# Patient Record
Sex: Female | Born: 1980
Health system: Southern US, Community
[De-identification: ages and names within clinical notes are randomized; demographics above are authoritative.]

## PROBLEM LIST (undated history)

## (undated) HISTORY — PX: APPENDECTOMY: SHX54

---
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
## 2003-02-07 ENCOUNTER — Emergency Department (HOSPITAL_COMMUNITY): Admission: EM | Admit: 2003-02-07 | Discharge: 2003-02-07 | Payer: Self-pay | Admitting: Emergency Medicine

## 2003-02-10 ENCOUNTER — Emergency Department (HOSPITAL_COMMUNITY): Admission: EM | Admit: 2003-02-10 | Discharge: 2003-02-10 | Payer: Self-pay | Admitting: Emergency Medicine

## 2006-04-03 ENCOUNTER — Emergency Department (HOSPITAL_COMMUNITY): Admission: EM | Admit: 2006-04-03 | Discharge: 2006-04-03 | Payer: Self-pay | Admitting: Emergency Medicine

## 2008-02-15 IMAGING — US US PELVIS COMPLETE MODIFY
1 series · 14 of 25 positions shown · non-contrast
Comparison: none

CLINICAL DATA: Abdominal and low back pain. 
TRANSABDOMINAL AND TRANSVAGINAL PELVIC ULTRASOUND:
TECHNIQUE: Both transabdominal and transvaginal ultrasound examinations of the pelvis were performed including evaluation of the uterus, ovaries, adnexal regions, and pelvic cul-de-sac.

[Series 1: unknown · 0.32mm/px · 14 of 55 slices shown]
[im 1/55]
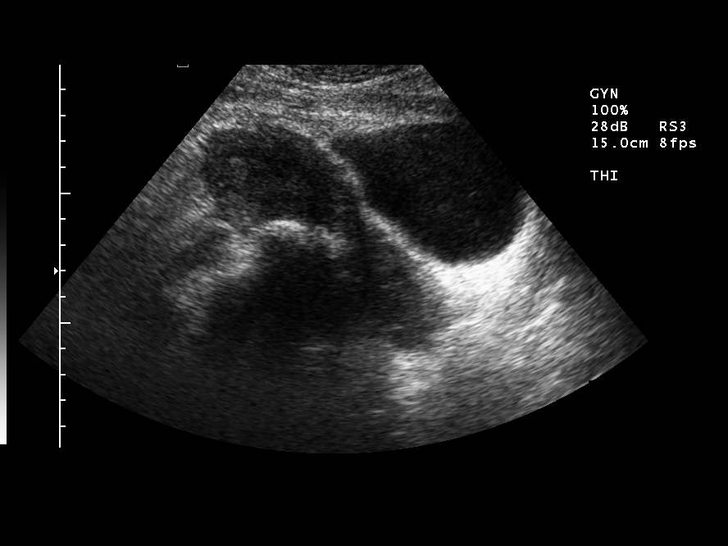
[im 5/55]
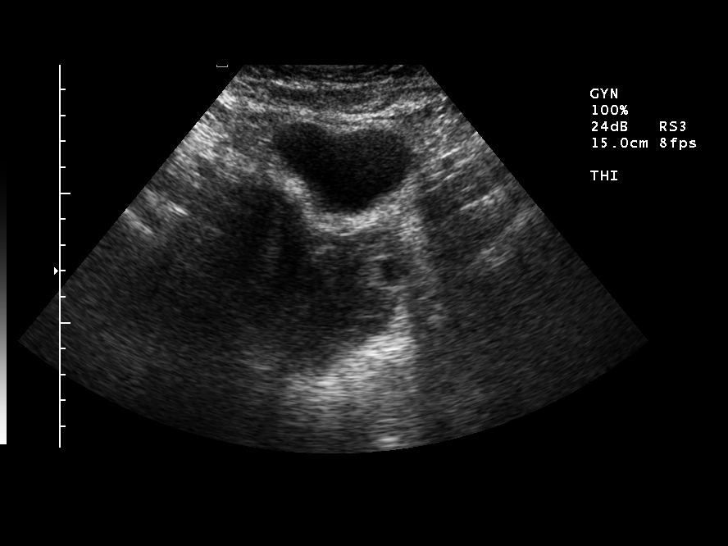
[im 10/55]
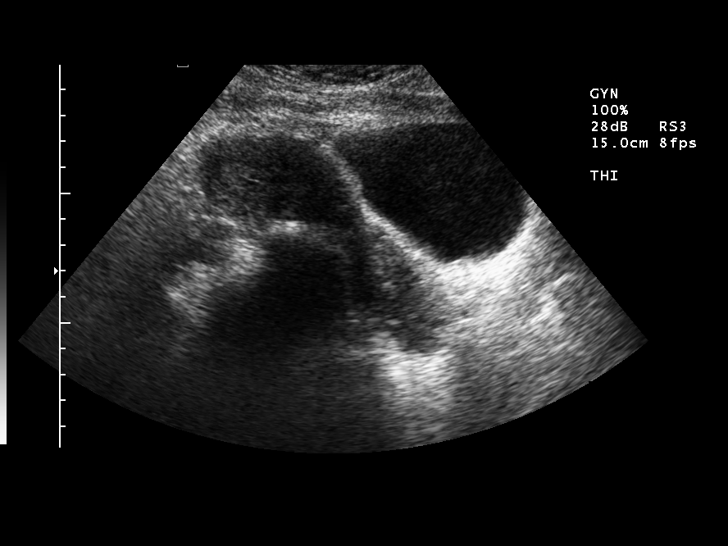
[im 14/55]
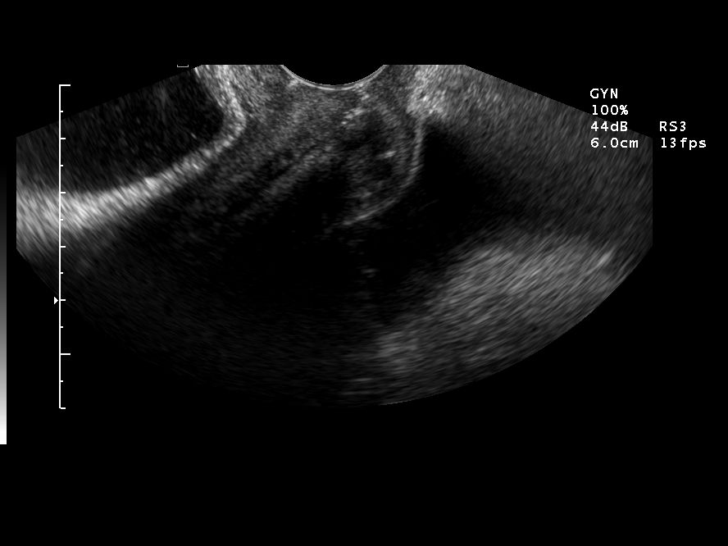
[im 19/55]
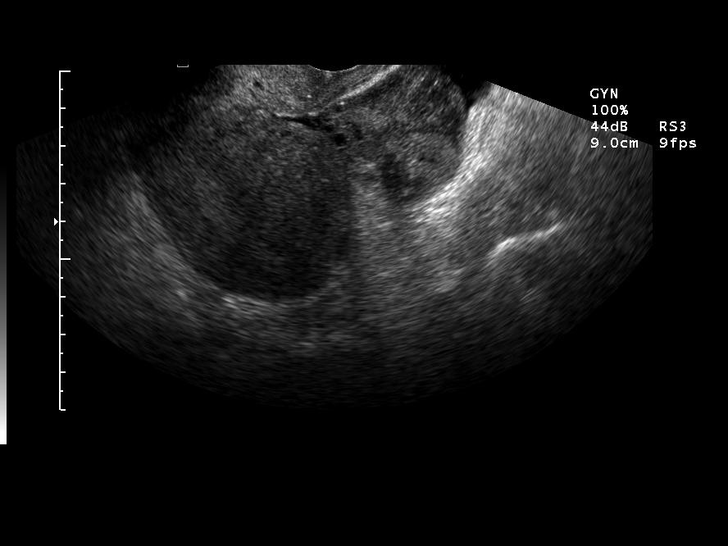
[im 21/55]
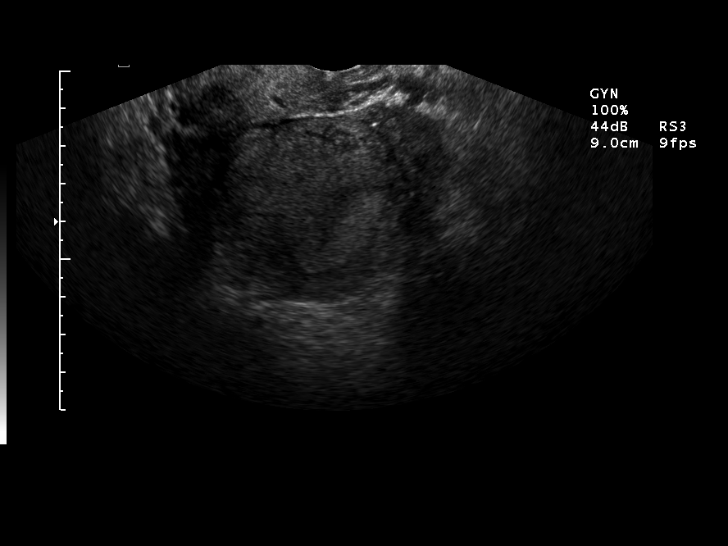
[im 25/55]
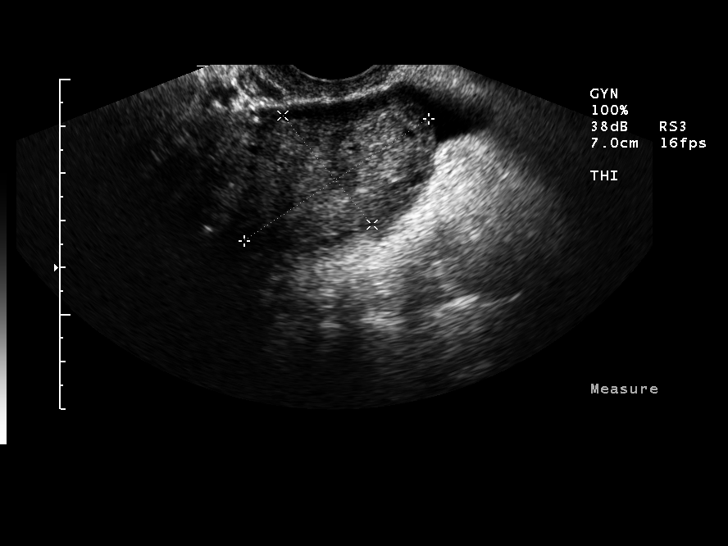
[im 30/55]
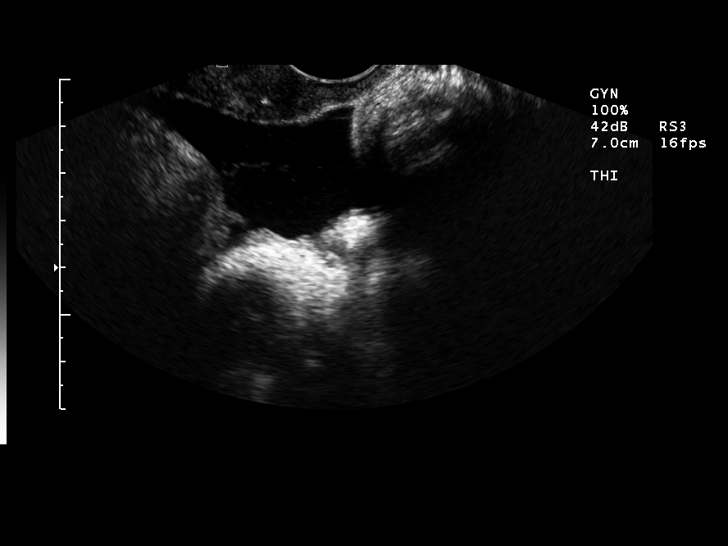
[im 34/55]
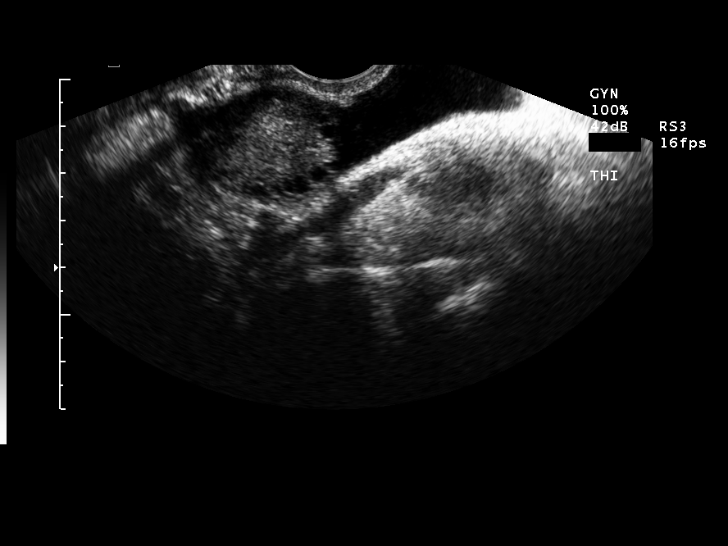
[im 37/55]
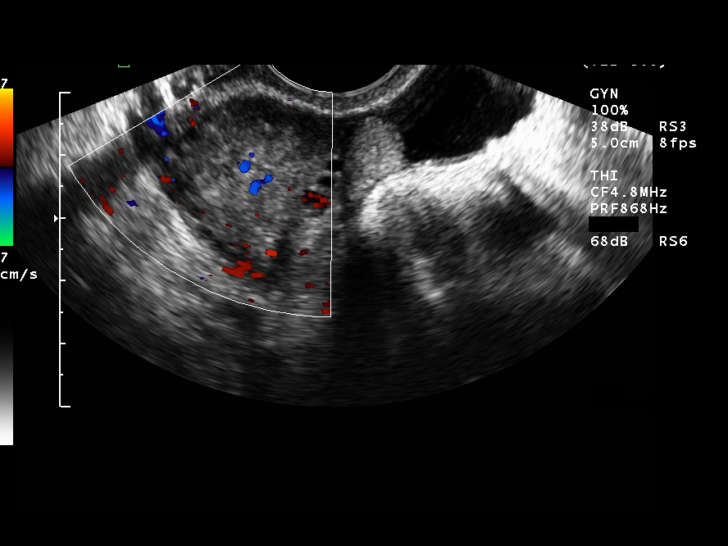
[im 41/55]
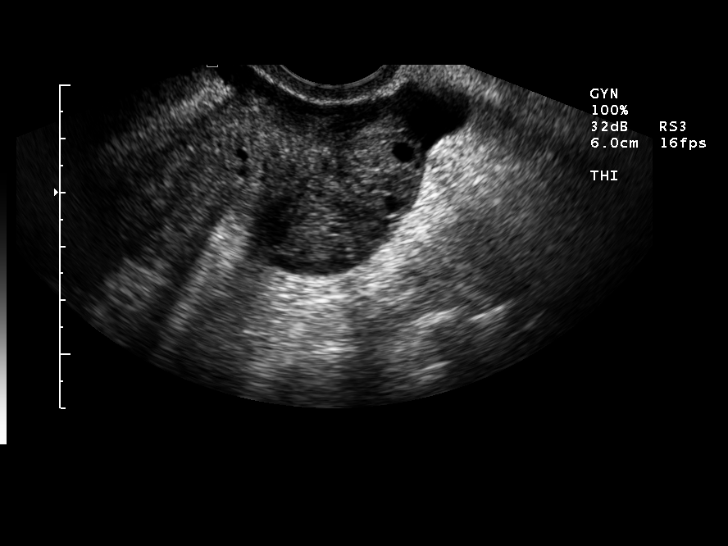
[im 46/55]
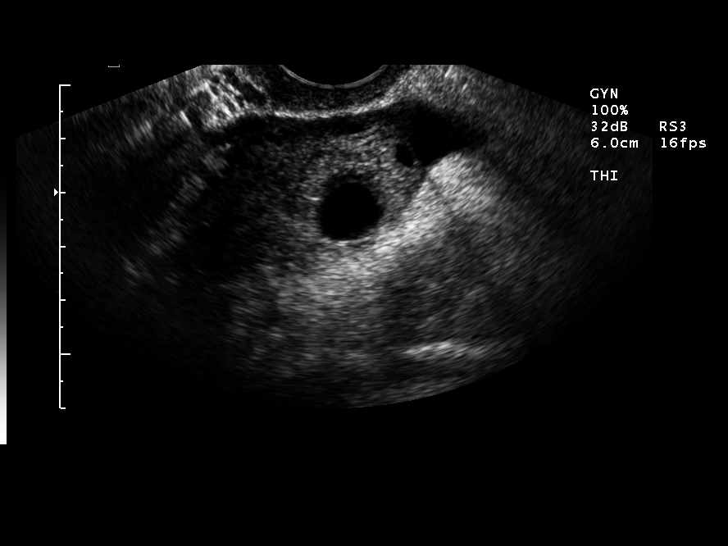
[im 50/55]
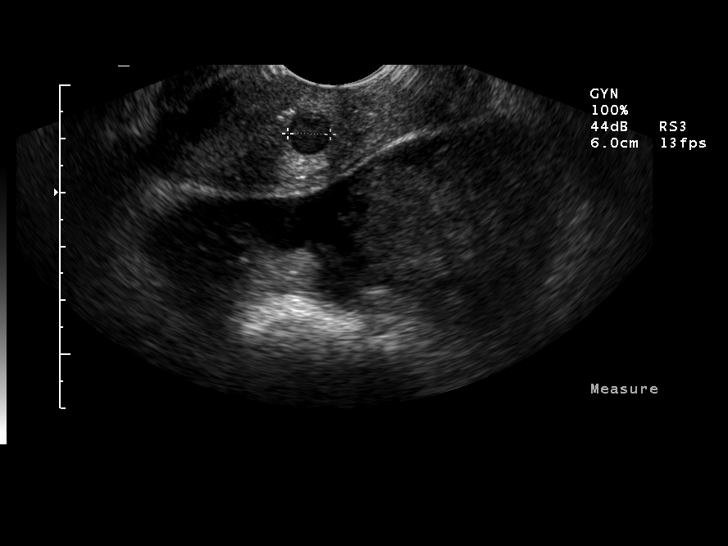
[im 55/55]
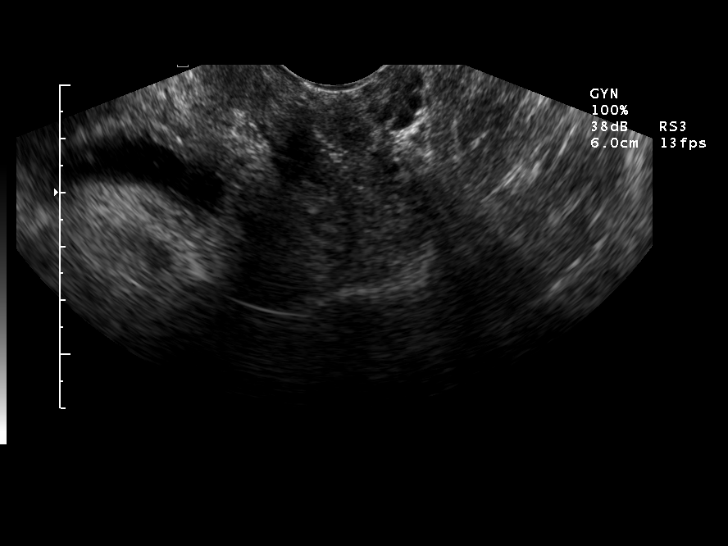

[14 of 25 positions shown; findings below may reference images not displayed]

FINDINGS: The uterus measures 9.0 cm in length and the fundus measures 5.1 x 5.4 cm.  A 0.7 x 0.8 x 1.1 cm cervical Nabothian cyst. Endometrial stripe complex measures 0.9 mm.  The right ovary measures 2.1 x 2.6 x 3.1 cm.  The left ovary appears somewhat prominent in size measuring 3.0 x 3.9 x 4.7 cm.  There is a small left ovarian cyst measuring 1.0 x 1.2 x 1.3 cm.  The left ovary is not only prominent in size, but has somewhat of a lobulated contour. I feel this most likely represents a normal ovary. There is bilateral adnexal/ovarian color flow. A moderate amount of free pelvic fluid.
IMPRESSION: Prominent in size, but otherwise unremarkable left ovary. Free pelvic fluid is noted, probably somewhat prominent in amount for physiologic fluid. It is possible the patient may have ruptured an ovarian cyst.

## 2008-02-15 IMAGING — CT CT ABDOMEN W/O CM
2 of 4 series · 17 of 46 positions shown, 19 images · IV contrast (agent unspecified)
Comparison: none

CLINICAL DATA: Left flank pain.  
ABDOMEN CT WITHOUT CONTRAST:
TECHNIQUE: Multidetector CT imaging of the abdomen was performed following the standard protocol without IV contrast.
TECHNIQUE: Multidetector CT imaging of the pelvis was performed following the standard protocol without IV contrast.

[Series 2: stone_wo 5.0 b40f st · axial · 0.67mm/px · z∈[-457,-101]mm · 14 of 101 slices shown, 16 images]
[im 6/101  soft-tissue]
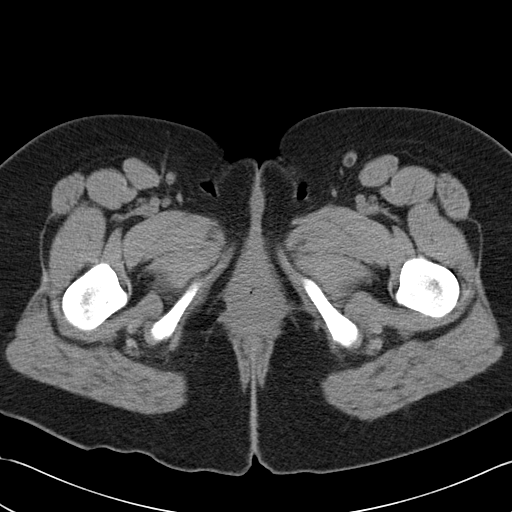
[im 6/101  bone]
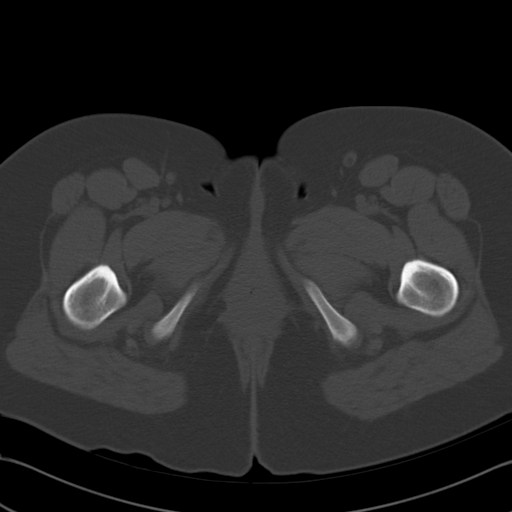
[im 12/101  soft-tissue]
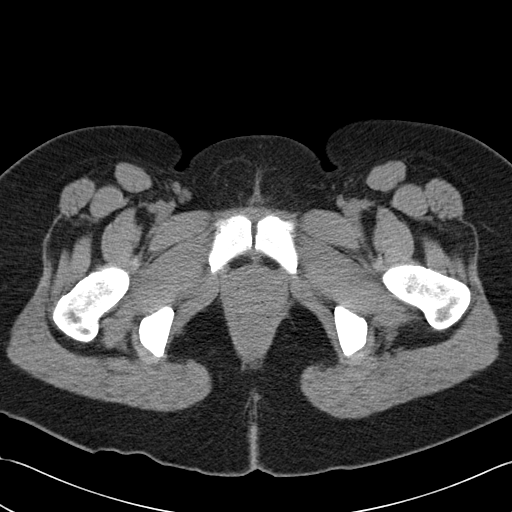
[im 18/101  soft-tissue]
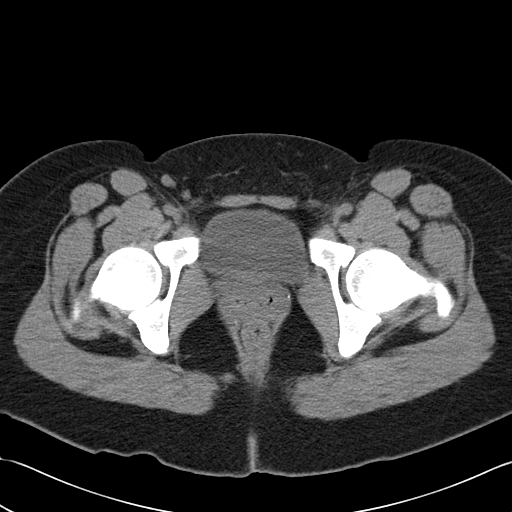
[im 30/101  soft-tissue]
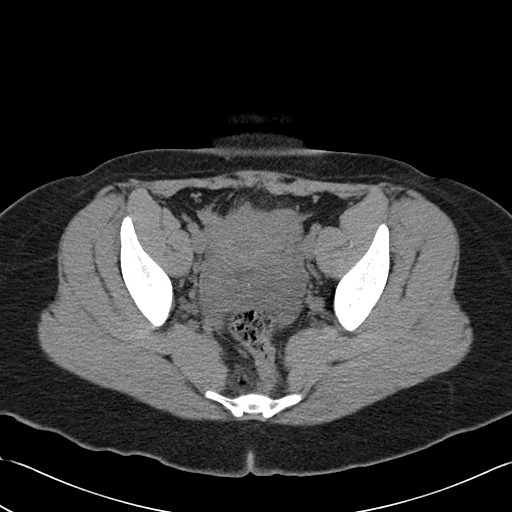
[im 36/101  soft-tissue]
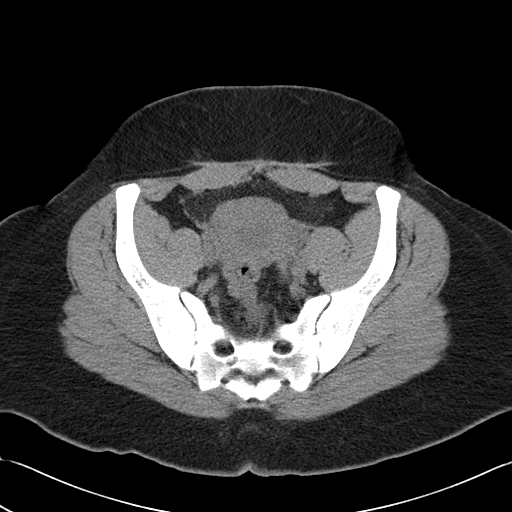
[im 42/101  soft-tissue]
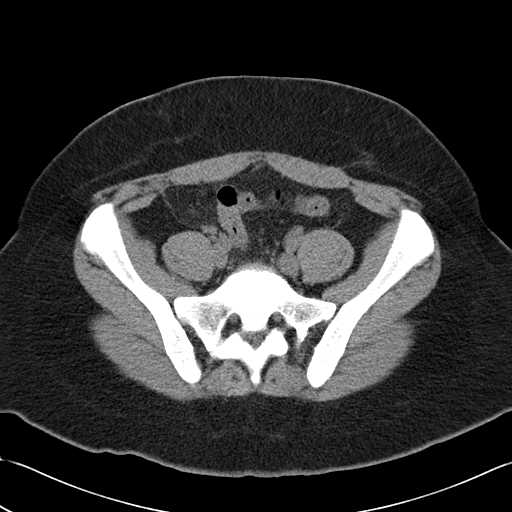
[im 48/101  soft-tissue]
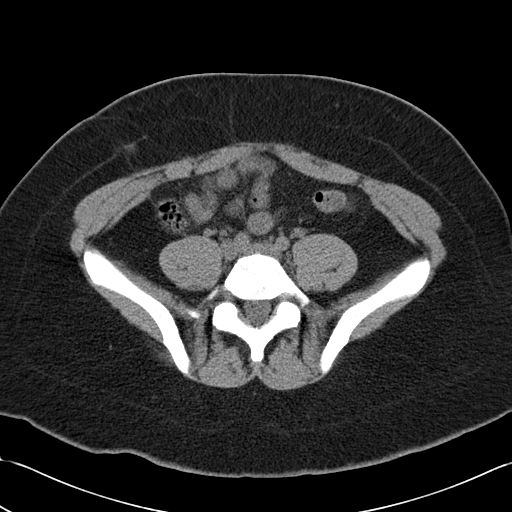
[im 53/101  soft-tissue]
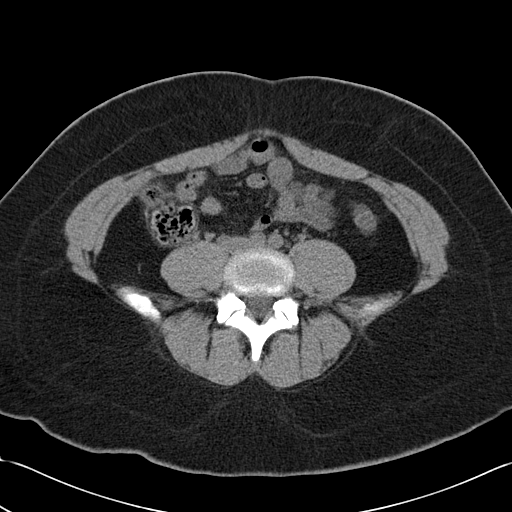
[im 59/101  soft-tissue]
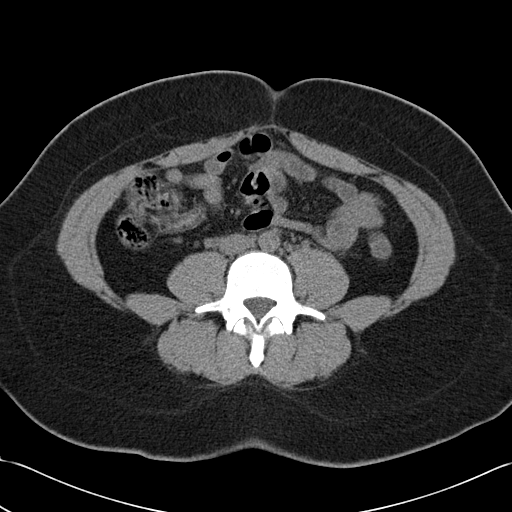
[im 59/101  bone]
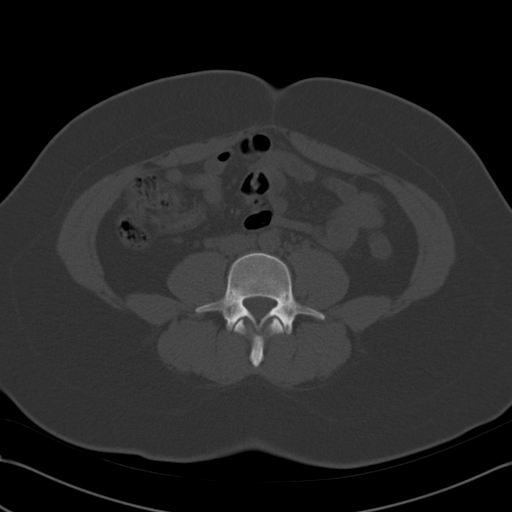
[im 65/101  soft-tissue]
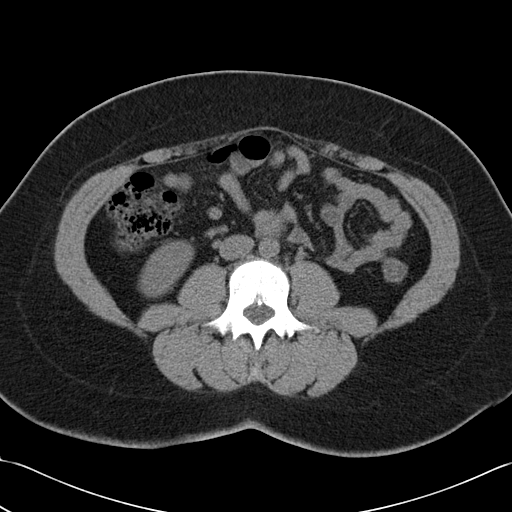
[im 77/101  soft-tissue]
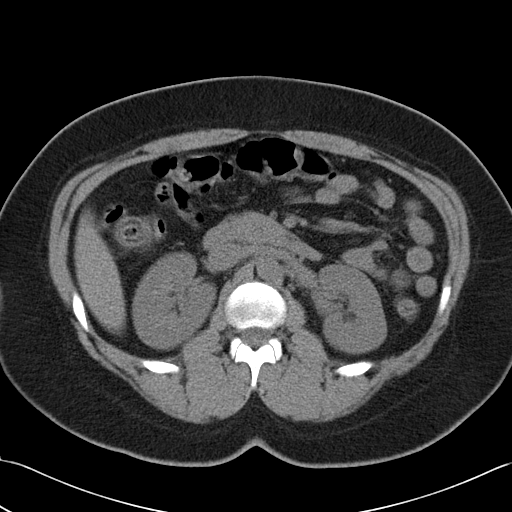
[im 83/101  soft-tissue]
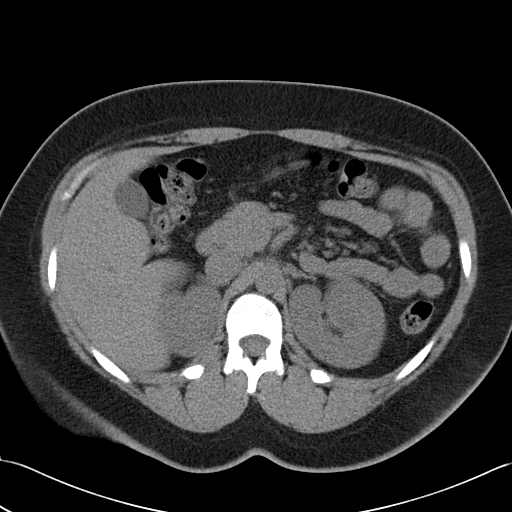
[im 89/101  soft-tissue]
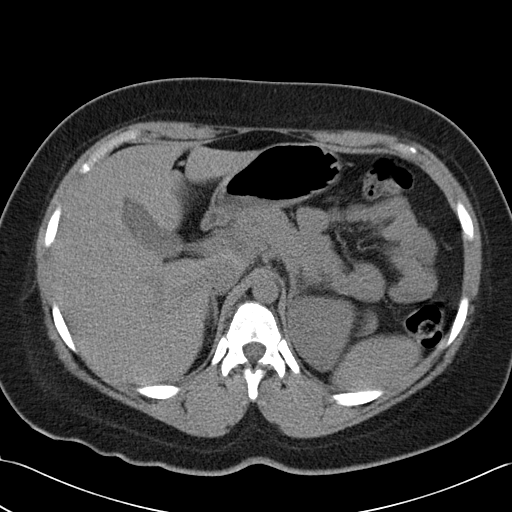
[im 95/101  soft-tissue]
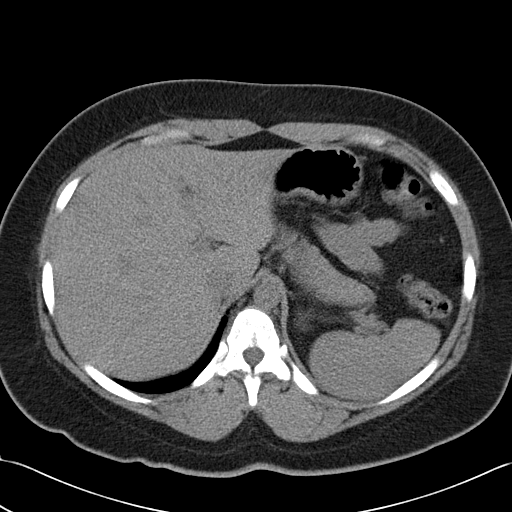

[Series 602: coronal · coronal · 0.82mm/px · 3 of 81 slices shown]
[im 27/81  soft-tissue]
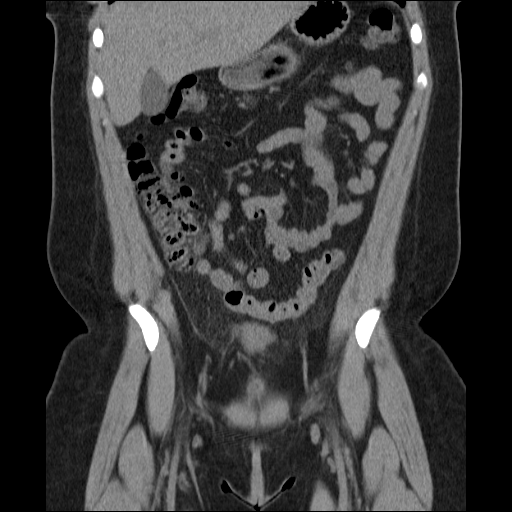
[im 36/81  soft-tissue]
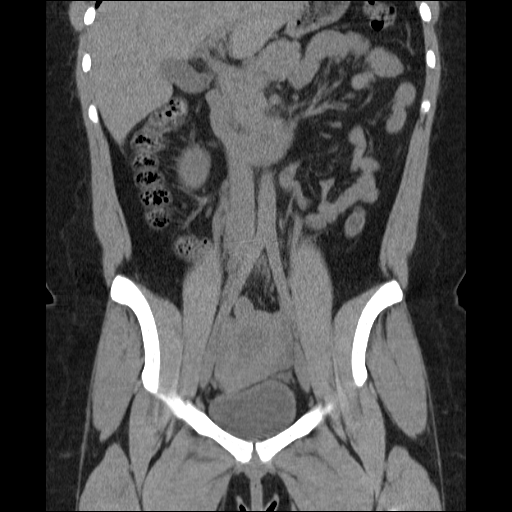
[im 45/81  soft-tissue]
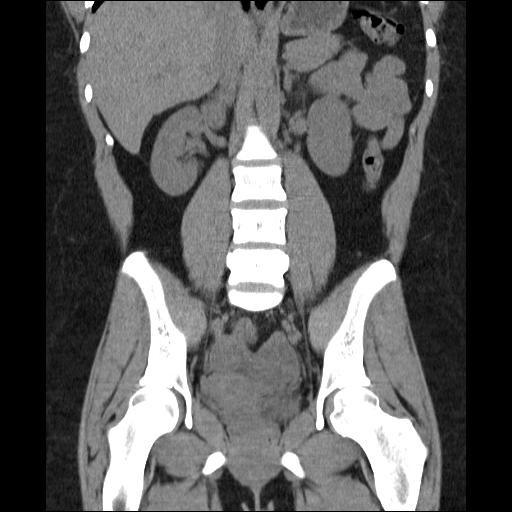

[17 of 46 positions shown; findings below may reference images not displayed]

FINDINGS: No urinary tract calcifications or evidence of obstruction.  No acute abdominal findings.
IMPRESSION: Negative abdominal CT scan.  
PELVIS CT WITHOUT CONTRAST:
FINDINGS: There is free pelvic fluid.  I am concerned that there [DATE] or 2 pelvic masses, with possible left adnexal masses measuring approximately 4.2 x 4.8 cm and potentially a right adnexal mass as well.  I would recommend pelvic ultrasound for further assessment.
IMPRESSION: Free pelvic fluid, nonspecific finding.  This may be due to recent rupture of ovarian cyst.  Suspicion for pelvic mass(es).  Recommend pelvic ultrasound.

## 2016-08-08 ENCOUNTER — Encounter (HOSPITAL_BASED_OUTPATIENT_CLINIC_OR_DEPARTMENT_OTHER): Payer: Self-pay | Admitting: Emergency Medicine

## 2016-08-08 ENCOUNTER — Emergency Department (HOSPITAL_BASED_OUTPATIENT_CLINIC_OR_DEPARTMENT_OTHER)
Admission: EM | Admit: 2016-08-08 | Discharge: 2016-08-08 | Disposition: A | Discharge status: Home / Self Care | Payer: BLUE CROSS/BLUE SHIELD | Attending: Emergency Medicine | Admitting: Emergency Medicine

## 2016-08-08 ENCOUNTER — Emergency Department (HOSPITAL_BASED_OUTPATIENT_CLINIC_OR_DEPARTMENT_OTHER): Payer: BLUE CROSS/BLUE SHIELD

## 2016-08-08 DIAGNOSIS — R112 Nausea with vomiting, unspecified: Secondary | ICD-10-CM | POA: Insufficient documentation

## 2016-08-08 DIAGNOSIS — R55 Syncope and collapse: Secondary | ICD-10-CM | POA: Insufficient documentation

## 2016-08-08 DIAGNOSIS — J069 Acute upper respiratory infection, unspecified: Secondary | ICD-10-CM | POA: Diagnosis not present

## 2016-08-08 DIAGNOSIS — R05 Cough: Secondary | ICD-10-CM | POA: Diagnosis present

## 2016-08-08 MED ORDER — BENZONATATE 100 MG PO CAPS: 100.0000 mg | ORAL_CAPSULE | Freq: Three times a day (TID) | ORAL | 0 refills | Status: DC

## 2016-08-08 MED ORDER — ONDANSETRON 4 MG PO TBDP: 4.0000 mg | ORAL_TABLET | Freq: Three times a day (TID) | ORAL | 0 refills | Status: DC | PRN

## 2016-08-08 MED FILL — ONDANSETRON ODT 4 MG TABLET: 4 | 6 days supply | Qty: 6 | Fill #0

## 2016-08-08 MED FILL — BENZONATATE 100 MG CAP: 100 | 5 days supply | Qty: 15 | Fill #0

## 2016-08-08 NOTE — ED Provider Notes (Signed)
[facility]   CSN: [id number] Arrival date & time: 08/08/16  0941     History   Chief Complaint Chief Complaint  Patient presents with  . Cough  . Nausea    HPI Ashley Shelton is a 36 y.o. female.  Patient presents 3 days of upper respiratory infection symptoms including runny nose, nasal congestion, and sore throat. Patient developed a cough 2 days ago and has been persistently coughing. 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 She had ringing in her ears and tunnel vision for approximately 3 minutes. This then resolved and patient became very nauseous and vomited. She did not fully pass out. She denies any chest pains or shortness of breath during this time. She no longer feels lightheaded but continues to feel nauseous. She continues coughing. Lightheaded spell did not seem to be triggered or caused by coughing. Patient has been taking over-the-counter cough medications and decongestants for her symptoms. No other treatments. She has been eating and drinking well. No history of cardiac problems or arrhythmia. The onset of this condition was acute. The course is constant. Aggravating factors: none. Alleviating factors: none.        History reviewed. No pertinent past medical history.  There are no active problems to display for this patient.   Past Surgical History:  Procedure Laterality Date  . APPENDECTOMY    . CESAREAN SECTION      OB History    No data available       Home Medications    Prior to Admission medications   Not on File    Family History No family history on file.  Social History Social History  Substance Use Topics  . Smoking status: Never Smoker  . Smokeless tobacco: Never Used  . Alcohol use Yes     Comment: occ     Allergies   Patient has no known allergies.   Review of Systems Review of Systems  Constitutional: Negative for  fever.  HENT: Positive for congestion, rhinorrhea and sore throat.   Eyes: Negative for redness.  Respiratory: Positive for cough.   Cardiovascular: Negative for chest pain.  Gastrointestinal: Positive for nausea and vomiting. Negative for abdominal pain and diarrhea.  Genitourinary: Negative for dysuria.  Musculoskeletal: Negative for myalgias.  Skin: Negative for rash.  Neurological: Positive for dizziness. Negative for syncope (+ Near-syncope) and headaches.     Physical Exam Updated Vital Signs BP (!) 149/52 (BP Location: Right Arm)   Pulse 85   Temp 98.1 F (36.7 C) (Oral)   Resp 18   Ht 5\' 6"  (1.676 m)   Wt 108.9 kg (240 lb)   LMP 07/28/2016 (Approximate)   SpO2 99%   BMI 38.74 kg/m   Physical Exam  Constitutional: She appears well-developed and well-nourished.  HENT:  Head: Normocephalic and atraumatic.  Right Ear: Tympanic membrane, external ear and ear canal normal.  Left Ear: Tympanic membrane, external ear and ear canal normal.  Nose: Mucosal edema present. No rhinorrhea.  Mouth/Throat: Uvula is midline, oropharynx is clear and moist and mucous membranes are normal. Mucous membranes are not dry. No oral lesions. No trismus in the jaw. No uvula swelling. No oropharyngeal exudate, posterior oropharyngeal edema, posterior oropharyngeal erythema or tonsillar abscesses.  Eyes: Conjunctivae are normal. Right eye exhibits no discharge. Left eye exhibits no discharge.  Neck: Normal range of motion. Neck supple.  Cardiovascular: Normal rate, regular rhythm  and normal heart sounds.   No murmur heard. Pulmonary/Chest: Effort normal and breath sounds normal. No respiratory distress. She has no wheezes. She has no rales.  Frequent coughing during exam.  Abdominal: Soft. There is no tenderness.  Lymphadenopathy:    She has no cervical adenopathy.  Neurological: She is alert.  Skin: Skin is warm and dry.  Psychiatric: She has a normal mood and affect.  Nursing note and  vitals reviewed.    ED Treatments / Results  Labs (all labs ordered are listed, but only abnormal results are displayed) Labs Reviewed - No data to display  ED ECG REPORT   Date: 08/08/2016  Rate: 75  Rhythm: normal sinus rhythm  QRS Axis: normal  Intervals: normal  ST/T Wave abnormalities: normal  Conduction Disutrbances:none  Narrative Interpretation:   Old EKG Reviewed: unchanged  I have personally reviewed the EKG tracing and agree with the computerized printout as noted.  Radiology Dg Chest 2 View  Result Date: 08/08/2016 CLINICAL DATA:  Cough. EXAM: CHEST  2 VIEW COMPARISON:  None FINDINGS: The heart size and mediastinal contours are within normal limits. Both lungs are clear. The visualized skeletal structures are unremarkable. IMPRESSION: No active cardiopulmonary disease. Electronically Signed   By: Signa Kell M.D.   On: 08/08/2016 10:17    Procedures Procedures (including critical care time)  Medications Ordered in ED Medications - No data to display   Initial Impression / Assessment and Plan / ED Course  I have reviewed the triage vital signs and the nursing notes.  Pertinent labs & imaging results that were available during my care of the patient were reviewed by me and considered in my medical decision making (see chart for details).     Patient seen and examined. CXR reviewed. Given Near syncopal spell, will check EKG and orthostatics. Likely discharge to home with medicine for cough, nausea, and conservative measures for URI.   Vital signs reviewed and are as follows: BP (!) 149/52 (BP Location: Right Arm)   Pulse 85   Temp 98.1 F (36.7 C) (Oral)   Resp 18   Ht 5\' 6"  (1.676 m)   Wt 108.9 kg (240 lb)   LMP 07/28/2016 (Approximate)   SpO2 99%   BMI 38.74 kg/m   11:01 AM EKG is normal. Chest x-ray is clear. Patient is not orthostatic. Will discharge to home, patient encouraged to rest.  Return with worsening symptoms, recurrent  lightheadedness or syncope, chest pain, shortness of breath. Patient verbalizes understanding and agrees with plan.      Final Clinical Impressions(s) / ED Diagnoses   Final diagnoses:  Upper respiratory tract infection, unspecified type  Near syncope  Nausea and vomiting, intractability of vomiting not specified, unspecified vomiting type   Patient presents after near syncopal spell today in setting of recent URI. Patient with frequent coughing. Workup unremarkable. LMP 07/28/16. Feel sick. Likely multifactorial due to viral illness. She had a prodrome. EKG and chest x-ray unremarkable. She appears well. No concerning risk factors for cardiogenic syncope.   No dangerous or life-threatening conditions suspected or identified by history, physical exam, and by work-up. No indications for hospitalization identified.      New Prescriptions New Prescriptions   BENZONATATE (TESSALON) 100 MG CAPSULE    Take 1 capsule (100 mg total) by mouth every 8 (eight) hours.   ONDANSETRON (ZOFRAN ODT) 4 MG DISINTEGRATING TABLET    Take 1 tablet (4 mg total) by mouth every 8 (eight) hours as needed for nausea  or vomiting.     Renne Crigler, PA-C 08/08/16 1103    Gwyneth Sprout, MD 08/08/16 [phone]

## 2016-08-08 NOTE — ED Triage Notes (Signed)
Pt reports congestion and mild sore throat on Sunday, progressing to cough yesterday and nausea this morning.  Pt vomited once this morning and states she has felt some dizziness also.  Pt states she has been eating and drinking per her norm this week.  Denies any diarrhea, abdominal or back pain.

## 2016-08-08 NOTE — Discharge Instructions (Signed)
Please read and follow all provided instructions.  Your diagnoses today include:  1. Upper respiratory tract infection, unspecified type   2. Near syncope   3. Nausea and vomiting, intractability of vomiting not specified, unspecified vomiting type    You appear to have an upper respiratory infection (URI). An upper respiratory tract infection, or cold, is a viral infection of the air passages leading to the lungs. It should improve gradually after 5-7 days. You may have a lingering cough that lasts for 2- 4 weeks after the infection.  Tests performed today include:  Vital signs. See below for your results today.   Medications prescribed:   Zofran (ondansetron) - for nausea and vomiting   Tessalon Perles - cough suppressant medication  Take any prescribed medications only as directed. Treatment for your infection is aimed at treating the symptoms. There are no medications, such as antibiotics, that will cure your infection.   Home care instructions:  Follow any educational materials contained in this packet.   Your illness is contagious and can be spread to others, especially during the first 3 or 4 days. It cannot be cured by antibiotics or other medicines. Take basic precautions such as washing your hands often, covering your mouth when you cough or sneeze, and avoiding public places where you could spread your illness to others.   Please continue drinking plenty of fluids.  Use over-the-counter medicines as needed as directed on packaging for symptom relief.  You may also use ibuprofen or tylenol as directed on packaging for pain or fever.  Do not take multiple medicines containing Tylenol or acetaminophen to avoid taking too much of this medication.  Follow-up instructions: Please follow-up with your primary care provider in the next 3 days for further evaluation of your symptoms if you are not feeling better.   Return instructions:   Please return to the Emergency Department if  you experience worsening symptoms.   RETURN IMMEDIATELY IF you develop shortness of breath, confusion or altered mental status, a new rash, become dizzy, faint, or poorly responsive, or are unable to be cared for at home.  Please return if you have persistent vomiting and cannot keep down fluids or develop a fever that is not controlled by tylenol or motrin.    Please return if you have any other emergent concerns.  Additional Information:  Your vital signs today were: BP (!) 149/52 (BP Location: Right Arm)    Pulse 85    Temp 98.1 F (36.7 C) (Oral)    Resp 18    Ht 5\' 6"  (1.676 m)    Wt 108.9 kg (240 lb)    LMP 07/28/2016 (Approximate)    SpO2 99%    BMI 38.74 kg/m  If your blood pressure (BP) was elevated above 135/85 this visit, please have this repeated by your doctor within one month. --------------

## 2016-10-03 ENCOUNTER — Encounter: Payer: Self-pay | Admitting: Family Medicine

## 2016-11-29 ENCOUNTER — Emergency Department (HOSPITAL_BASED_OUTPATIENT_CLINIC_OR_DEPARTMENT_OTHER)
Admission: EM | Admit: 2016-11-29 | Discharge: 2016-11-30 | Disposition: A | Discharge status: Home / Self Care | Payer: BLUE CROSS/BLUE SHIELD | Attending: Emergency Medicine | Admitting: Emergency Medicine

## 2016-11-29 ENCOUNTER — Encounter (HOSPITAL_BASED_OUTPATIENT_CLINIC_OR_DEPARTMENT_OTHER): Payer: Self-pay | Admitting: Emergency Medicine

## 2016-11-29 DIAGNOSIS — H8113 Benign paroxysmal vertigo, bilateral: Secondary | ICD-10-CM | POA: Diagnosis not present

## 2016-11-29 DIAGNOSIS — H6123 Impacted cerumen, bilateral: Secondary | ICD-10-CM | POA: Diagnosis not present

## 2016-11-29 DIAGNOSIS — H8110 Benign paroxysmal vertigo, unspecified ear: Secondary | ICD-10-CM

## 2016-11-29 DIAGNOSIS — R42 Dizziness and giddiness: Secondary | ICD-10-CM | POA: Diagnosis present

## 2016-11-29 LAB — PREGNANCY, URINE: Preg Test, Ur: NEGATIVE

## 2016-11-29 LAB — CBC WITH DIFFERENTIAL/PLATELET
Basophils Absolute: 0 10*3/uL (ref 0.0–0.1)
Basophils Relative: 0 %
Eosinophils Absolute: 0 10*3/uL (ref 0.0–0.7)
Eosinophils Relative: 0 %
HCT: 33 % — ABNORMAL LOW (ref 36.0–46.0)
Hemoglobin: 11.1 g/dL — ABNORMAL LOW (ref 12.0–15.0)
Lymphocytes Relative: 12 %
Lymphs Abs: 1.5 10*3/uL (ref 0.7–4.0)
MCH: 29.5 pg (ref 26.0–34.0)
MCHC: 33.6 g/dL (ref 30.0–36.0)
MCV: 87.8 fL (ref 78.0–100.0)
Monocytes Absolute: 0.4 10*3/uL (ref 0.1–1.0)
Monocytes Relative: 3 %
Neutro Abs: 10.6 10*3/uL — ABNORMAL HIGH (ref 1.7–7.7)
Neutrophils Relative %: 85 %
Platelets: 236 10*3/uL (ref 150–400)
RBC: 3.76 MIL/uL — ABNORMAL LOW (ref 3.87–5.11)
RDW: 12.5 % (ref 11.5–15.5)
WBC: 12.5 10*3/uL — ABNORMAL HIGH (ref 4.0–10.5)

## 2016-11-29 LAB — BASIC METABOLIC PANEL
Anion gap: 8 (ref 5–15)
BUN: 14 mg/dL (ref 6–20)
CO2: 21 mmol/L — ABNORMAL LOW (ref 22–32)
Calcium: 8.4 mg/dL — ABNORMAL LOW (ref 8.9–10.3)
Chloride: 106 mmol/L (ref 101–111)
Creatinine, Ser: 0.73 mg/dL (ref 0.44–1.00)
GFR calc Af Amer: >60 mL/min (ref >60)
GFR calc non Af Amer: >60 mL/min (ref >60)
Glucose, Bld: 198 mg/dL — ABNORMAL HIGH (ref 65–99)
Potassium: 3.3 mmol/L — ABNORMAL LOW (ref 3.5–5.1)
Sodium: 135 mmol/L (ref 135–145)

## 2016-11-29 MED ORDER — SODIUM CHLORIDE 0.9 % IV BOLUS (SEPSIS)
1000.0000 mL | Freq: Once | INTRAVENOUS | Status: AC
  Administered 2016-11-29: 1000 mL via INTRAVENOUS

## 2016-11-29 MED ORDER — ONDANSETRON HCL 4 MG/2ML IJ SOLN
4.0000 mg | Freq: Once | INTRAMUSCULAR | Status: AC
  Administered 2016-11-29: 4 mg via INTRAVENOUS
  Filled 2016-11-29: qty 2

## 2016-11-29 MED ORDER — MECLIZINE HCL 25 MG PO TABS
25.0000 mg | ORAL_TABLET | Freq: Once | ORAL | Status: AC
  Administered 2016-11-29: 25 mg via ORAL
  Filled 2016-11-29: qty 1

## 2016-11-29 MED ORDER — ONDANSETRON HCL 4 MG/2ML IJ SOLN: 4.0000 mg | Freq: Once | INTRAMUSCULAR | Status: DC

## 2016-11-29 NOTE — ED Provider Notes (Signed)
TIME SEEN: 11:01 PM  CHIEF COMPLAINT: Vertigo  HPI: Patient is a 36 year old female with no significant past history who presents emergency department with complaints of vertigo. States she feels that the room is spinning and has had several episodes of vomiting since the vertigo started tonight while at work. No diarrhea. Has mild headache but no head injury. No numbness, tingling or focal weakness. No vision or speech changes. No hearing loss, tinnitus, ear pain. No fever. Has never had vertigo before. Symptoms worse with changes in position. She feels better when she is staying still. No chest pain or shortness of breath. She was orthostatic with EMS.  ROS: See HPI Constitutional: no fever  Eyes: no drainage  ENT: no runny nose   Cardiovascular:  no chest pain  Resp: no SOB  GI: no vomiting GU: no dysuria Integumentary: no rash  Allergy: no hives  Musculoskeletal: no leg swelling  Neurological: no slurred speech ROS otherwise negative  PAST MEDICAL HISTORY/PAST SURGICAL HISTORY:  History reviewed. No pertinent past medical history.  MEDICATIONS:  Prior to Admission medications   Not on File    ALLERGIES:  No Known Allergies  SOCIAL HISTORY:  Social History  Substance Use Topics  . Smoking status: Never Smoker  . Smokeless tobacco: Never Used  . Alcohol use Yes     Comment: occ    FAMILY HISTORY: No family history on file.  EXAM: BP 121/85   Pulse 79   Temp 97.9 F (36.6 C) (Oral)   Resp (!) 29   Ht  (1.676 m)   Wt 108.9 kg (240 lb)   SpO2 100%   BMI 38.74 kg/m  CONSTITUTIONAL: Alert and oriented and responds appropriately to questions. Well-appearing; well-nourished HEAD: Normocephalic EYES: Conjunctivae clear, pupils appear equal, EOMI, No nystagmus noted on exam ENT: normal nose; moist mucous membranes, initially patient has bilateral cerumen impaction and I'm unable to visualize her tympanic membranes NECK: Supple, no meningismus, no nuchal  rigidity, no LAD  CARD: RRR; S1 and S2 appreciated; no murmurs, no clicks, no rubs, no gallops RESP: Normal chest excursion without splinting or tachypnea; breath sounds clear and equal bilaterally; no wheezes, no rhonchi, no rales, no hypoxia or respiratory distress, speaking full sentences ABD/GI: Normal bowel sounds; non-distended; soft, non-tender, no rebound, no guarding, no peritoneal signs, no hepatosplenomegaly BACK:  The back appears normal and is non-tender to palpation, there is no CVA tenderness EXT: Normal ROM in all joints; non-tender to palpation; no edema; normal capillary refill; no cyanosis, no calf tenderness or swelling    SKIN: Normal color for age and race; warm; no rash NEURO: Moves all extremities equally; Strength 5/5 in all four extremities.  Normal sensation diffusely.  CN 2-12 grossly intact.  No dysmetria to finger to nose testing bilaterally.  Normal speech.  PSYCH: The patient's mood and manner are appropriate. Grooming and personal hygiene are appropriate.  MEDICAL DECISION MAKING: Patient here with vertigo. Suspect peripheral cause. She has no risk factors for stroke. She does have bilateral cerumen impaction. We will perform cerumen removal. Will treat symptomatic with IV fluids, Zofran, meclizine. Labs obtained in triage are unremarkable. Hemoglobin is 11. We'll also obtain a pregnancy test. EKG shows no ischemic abnormality.  ED PROGRESS: Patient's pregnancy test is negative. She is still symptomatic after meclizine and one random Zofran. We'll give another liter fluid, Valium, another dose of Zofran and reassess.   Patient reports feeling much better. She is able to drink and ambulate without difficulty  or assistance. No further vomiting. We have irrigated both of her ears and now they are completely clear. The tympanic membranes bilaterally are normal without erythema, bulging, purulence or effusion. No perforation. External auditory canals are patent without  erythema or swelling. There is no drainage at this time.  Suspect BPPV causing her symptoms. I feel she is safe to be discharged. Given information for Epley maneuvers at home. Given ENT follow-up if no improvement with outpatient symptomatic treatment. I feel she is safe for discharge. She has been advised not to drive until her vertigo has completely resolved. Family at bedside to drive her home.  At this time, I do not feel there is any life-threatening condition present. I have reviewed and discussed all results (EKG, imaging, lab, urine as appropriate) and exam findings with patient/family. I have reviewed nursing notes and appropriate previous records.  I feel the patient is safe to be discharged home without further emergent workup and can continue workup as an outpatient as needed. Discussed usual and customary return precautions. Patient/family verbalize understanding and are comfortable with this plan.  Outpatient follow-up has been provided if needed. All questions have been answered.      .Ear Cerumen Removal Date/Time: 11/30/2016 3:06 AM Performed by: Raelyn NumberWARD, Leniyah Martell N Authorized by: Raelyn NumberWARD, Asa Baudoin N   Consent:    Consent obtained:  Verbal   Consent given by:  Patient   Risks discussed:  Bleeding, dizziness, infection, incomplete removal, pain and TM perforation   Alternatives discussed:  No treatment Procedure details:    Location:  R ear and L ear   Procedure type: irrigation   Post-procedure details:    Inspection:  TM intact   Hearing quality:  Improved   Patient tolerance of procedure:  Tolerated well, no immediate complications      EKG Interpretation  Date/Time:  Thursday November 29 2016 21:29:19 EDT Ventricular Rate:  72 PR Interval:    QRS Duration: 102 QT Interval:  427 QTC Calculation: 468 R Axis:   48 Text Interpretation:  Sinus rhythm Abnormal R-wave progression, early transition Borderline T abnormalities, anterior leads No significant change since last  tracing Confirmed by Rochele RaringWard, Robinn Overholt [id number]) on 11/29/2016 11:01:07 PM         Delontae Lamm, Layla MawKristen N, DO 11/30/16 [id number]

## 2016-11-29 NOTE — ED Triage Notes (Addendum)
EMS initiated NS bolus and gave  zofran IV. CBG 160 by EMS

## 2016-11-29 NOTE — ED Notes (Signed)
EDP into room 

## 2016-11-29 NOTE — ED Notes (Signed)
Alert, NAD, calm, interactive, resps e/u, speaking in clear complete sentences, no dyspnea noted, skin W&D, VSS, "here s/p syncope while sitting at work, feel cold and weak now, felt dizziness earlier (resolved while lying), have vomited x5 in last 24 hrs, h/o anemia, no longer takes iron, admits to some nausea now, (denies: pain, sob, dizziness or visual changes). [facility]

## 2016-11-29 NOTE — ED Triage Notes (Signed)
Pt became dizzy while at work. Pt reports starting her menstrual cycle today and has a heavy flow. EMS reports pt with positive orthostatic VS.

## 2016-11-30 MED ORDER — SODIUM CHLORIDE 0.9 % IV BOLUS (SEPSIS)
1000.0000 mL | Freq: Once | INTRAVENOUS | Status: AC
  Administered 2016-11-30: 1000 mL via INTRAVENOUS

## 2016-11-30 MED ORDER — ONDANSETRON HCL 4 MG/2ML IJ SOLN
4.0000 mg | Freq: Once | INTRAMUSCULAR | Status: AC
  Administered 2016-11-30: 4 mg via INTRAVENOUS
  Filled 2016-11-30: qty 2

## 2016-11-30 MED ORDER — DIAZEPAM 5 MG/ML IJ SOLN
2.5000 mg | Freq: Once | INTRAMUSCULAR | Status: AC
  Administered 2016-11-30: 2.5 mg via INTRAVENOUS
  Filled 2016-11-30: qty 2

## 2016-11-30 MED ORDER — ONDANSETRON 4 MG PO TBDP: 4.0000 mg | ORAL_TABLET | Freq: Four times a day (QID) | ORAL | 0 refills | Status: DC | PRN

## 2016-11-30 MED ORDER — MECLIZINE HCL 25 MG PO TABS
25.0000 mg | ORAL_TABLET | Freq: Three times a day (TID) | ORAL | 0 refills | Status: DC | PRN
Start: 2016-11-30 — End: 2022-08-10

## 2016-11-30 NOTE — ED Notes (Signed)
ED Provider at bedside. 

## 2016-11-30 NOTE — ED Notes (Signed)
Alert, NAD, calm, interactive, resps e/u, speaking in clear complete sentences, no dyspnea noted, skin W&D, VSS, denies nausea or dizziness at this time, "feel better".  Family at [facility].

## 2016-11-30 NOTE — Discharge Instructions (Signed)
To find a primary care or specialty doctor please call [phone] or [phone] to access "[facility] Find a Doctor Service." ° °You may also go on the [facility] website at [email or web address] ° °There are also multiple [facility], [facility], [facility] and [facility] practices throughout the [place] that are frequently accepting new patients. You may find a clinic that is close to your home and contact them. ° °[facility] -  °[address] °[place] [address] °[phone] ° ° °[facility] -  °[address] °[place] [address] °[phone] ° ° °[facility] - °[address]  °[place] [address] °[phone] ° ° °

## 2018-06-22 IMAGING — DX DG CHEST 2V
2 series · 2 of 2 positions shown · non-contrast
Comparison: None

CLINICAL DATA: Cough.

EXAM:
CHEST  2 VIEW

[chest pa]
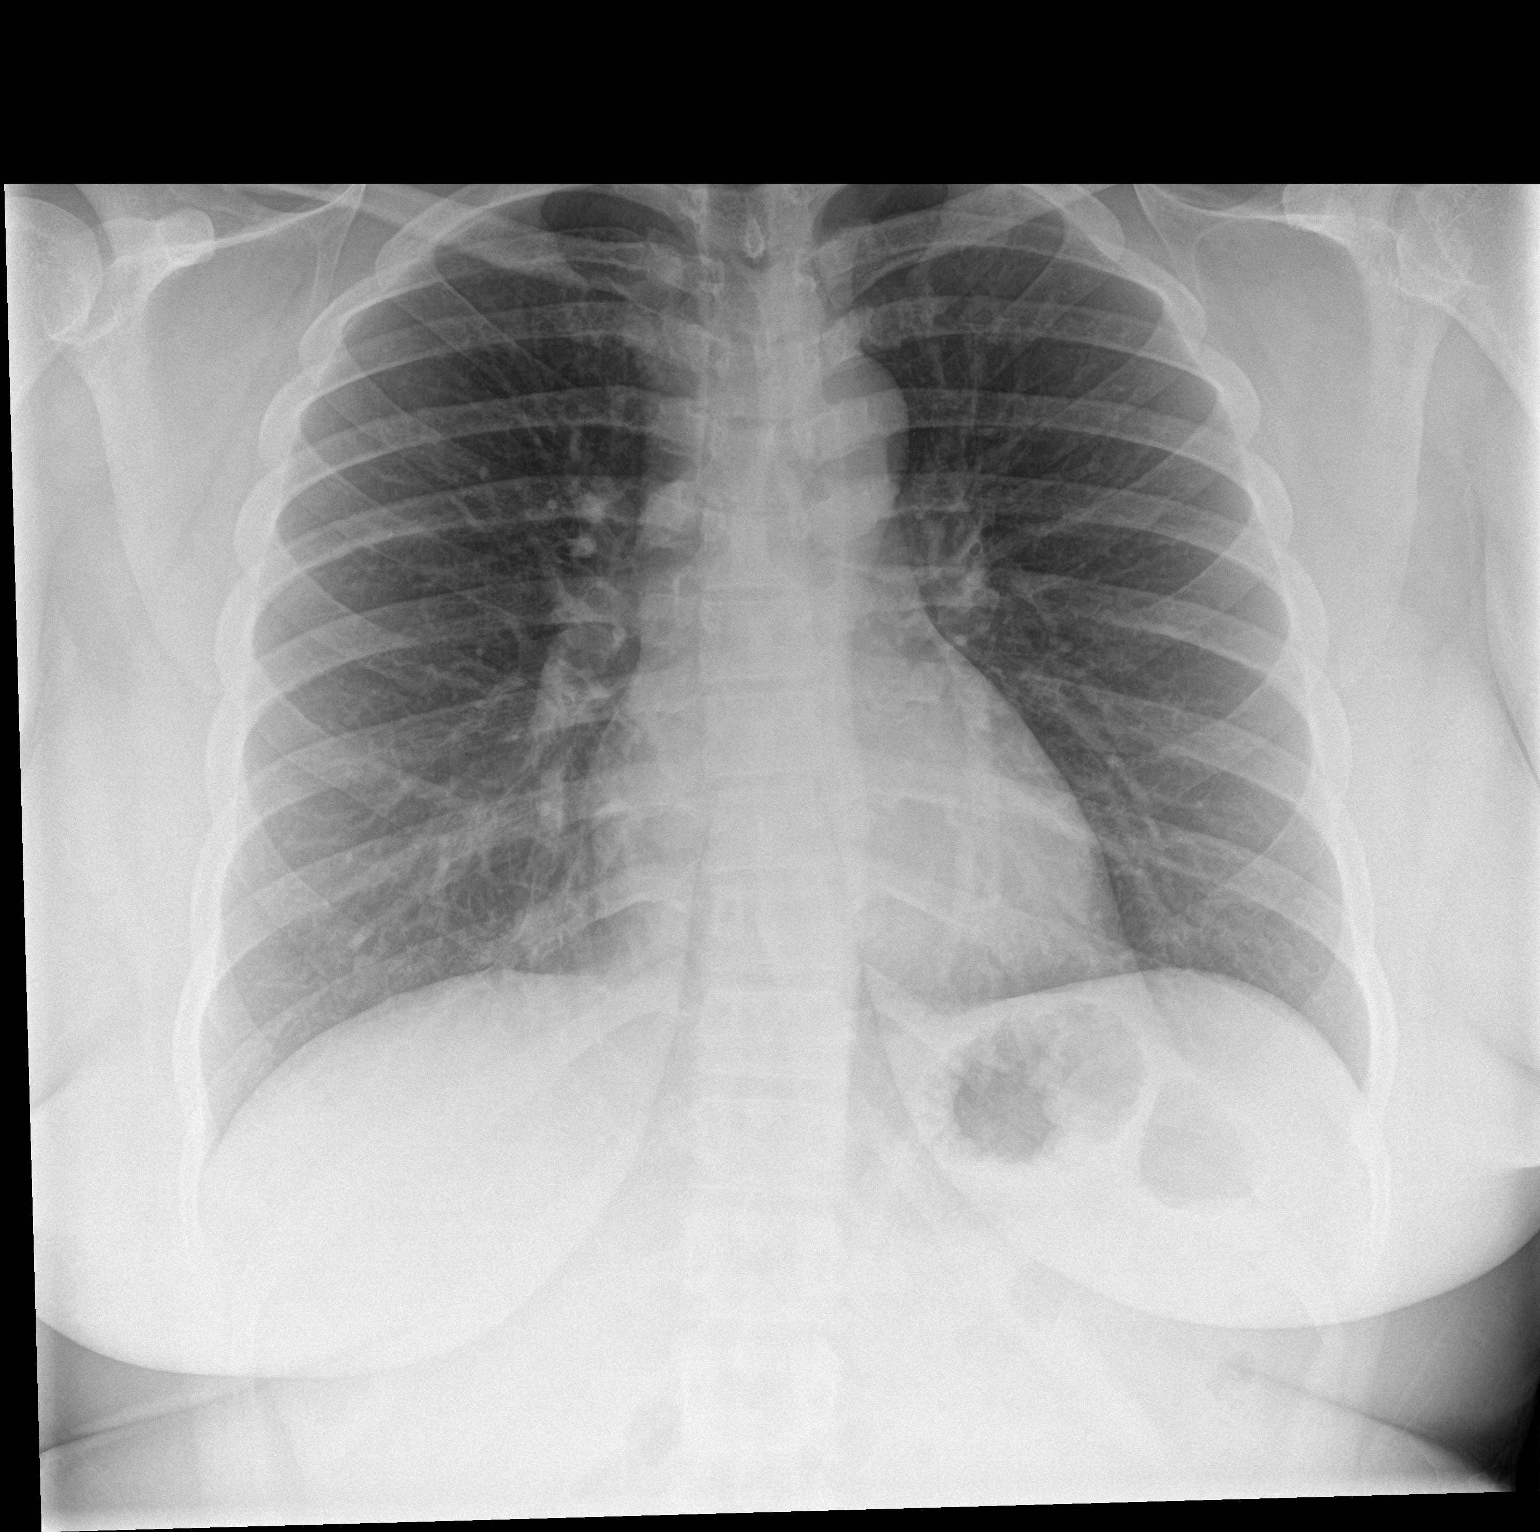

[chest lat]
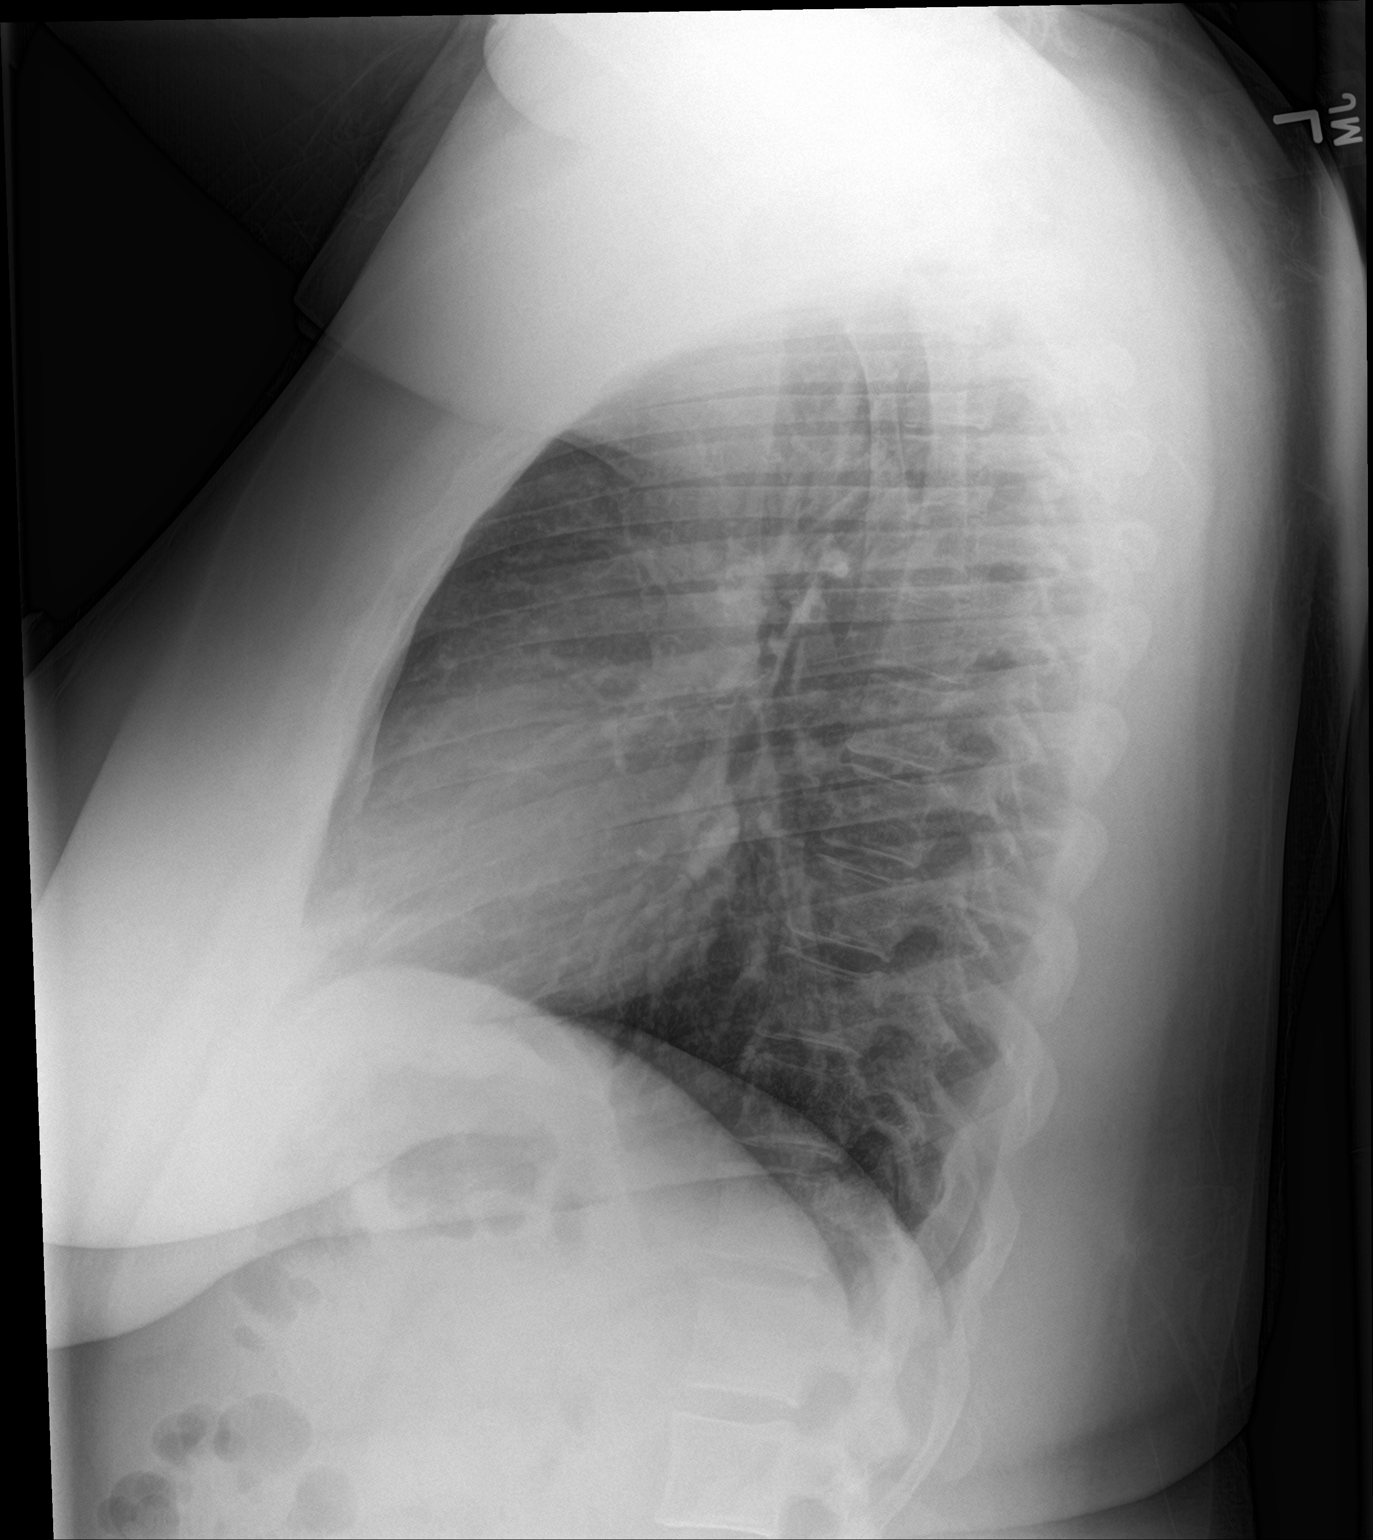

[2 of 2 positions shown; findings below may reference images not displayed]

FINDINGS: The heart size and mediastinal contours are within normal limits.
Both lungs are clear. The visualized skeletal structures are
unremarkable.
IMPRESSION: No active cardiopulmonary disease.

## 2022-08-09 ENCOUNTER — Emergency Department (HOSPITAL_BASED_OUTPATIENT_CLINIC_OR_DEPARTMENT_OTHER): Payer: 59 | Admitting: Radiology

## 2022-08-09 ENCOUNTER — Encounter (HOSPITAL_BASED_OUTPATIENT_CLINIC_OR_DEPARTMENT_OTHER): Payer: Self-pay | Admitting: Emergency Medicine

## 2022-08-09 ENCOUNTER — Other Ambulatory Visit: Payer: Self-pay

## 2022-08-09 ENCOUNTER — Emergency Department (HOSPITAL_BASED_OUTPATIENT_CLINIC_OR_DEPARTMENT_OTHER)
Admission: EM | Admit: 2022-08-09 | Discharge: 2022-08-10 | Disposition: A | Discharge status: Home / Self Care | Payer: 59 | Attending: Emergency Medicine | Admitting: Emergency Medicine

## 2022-08-09 ENCOUNTER — Emergency Department (HOSPITAL_BASED_OUTPATIENT_CLINIC_OR_DEPARTMENT_OTHER): Payer: 59

## 2022-08-09 DIAGNOSIS — R0602 Shortness of breath: Secondary | ICD-10-CM | POA: Diagnosis not present

## 2022-08-09 DIAGNOSIS — R109 Unspecified abdominal pain: Secondary | ICD-10-CM | POA: Insufficient documentation

## 2022-08-09 DIAGNOSIS — R519 Headache, unspecified: Secondary | ICD-10-CM | POA: Insufficient documentation

## 2022-08-09 DIAGNOSIS — R42 Dizziness and giddiness: Secondary | ICD-10-CM | POA: Insufficient documentation

## 2022-08-09 DIAGNOSIS — R112 Nausea with vomiting, unspecified: Secondary | ICD-10-CM | POA: Diagnosis not present

## 2022-08-09 LAB — CBC
HCT: 36.2 % (ref 36.0–46.0)
Hemoglobin: 12.2 g/dL (ref 12.0–15.0)
MCH: 29.4 pg (ref 26.0–34.0)
MCHC: 33.7 g/dL (ref 30.0–36.0)
MCV: 87.2 fL (ref 80.0–100.0)
Platelets: 304 10*3/uL (ref 150–400)
RBC: 4.15 MIL/uL (ref 3.87–5.11)
RDW: 11.9 % (ref 11.5–15.5)
WBC: 10.3 10*3/uL (ref 4.0–10.5)
nRBC: 0 % (ref 0.0–0.2)

## 2022-08-09 LAB — COMPREHENSIVE METABOLIC PANEL
ALT: 12 U/L (ref 0–44)
AST: 16 U/L (ref 15–41)
Albumin: 4.1 g/dL (ref 3.5–5.0)
Alkaline Phosphatase: 60 U/L (ref 38–126)
Anion gap: 13 (ref 5–15)
BUN: 10 mg/dL (ref 6–20)
CO2: 18 mmol/L — ABNORMAL LOW (ref 22–32)
Calcium: 9.3 mg/dL (ref 8.9–10.3)
Chloride: 103 mmol/L (ref 98–111)
Creatinine, Ser: 0.67 mg/dL (ref 0.44–1.00)
GFR, Estimated: >60 mL/min (ref >60)
Glucose, Bld: 141 mg/dL — ABNORMAL HIGH (ref 70–99)
Potassium: 3.5 mmol/L (ref 3.5–5.1)
Sodium: 134 mmol/L — ABNORMAL LOW (ref 135–145)
Total Bilirubin: 0.7 mg/dL (ref 0.3–1.2)
Total Protein: 8.2 g/dL — ABNORMAL HIGH (ref 6.5–8.1)

## 2022-08-09 LAB — LIPASE, BLOOD: Lipase: 26 U/L (ref 11–51)

## 2022-08-09 MED ORDER — LACTATED RINGERS IV BOLUS
1000.0000 mL | Freq: Once | INTRAVENOUS | Status: AC
  Administered 2022-08-09: 1000 mL via INTRAVENOUS

## 2022-08-09 MED ORDER — DIPHENHYDRAMINE HCL 50 MG/ML IJ SOLN
25.0000 mg | Freq: Once | INTRAMUSCULAR | Status: AC
  Administered 2022-08-09: 25 mg via INTRAVENOUS
  Filled 2022-08-09: qty 1

## 2022-08-09 MED ORDER — MECLIZINE HCL 25 MG PO TABS
25.0000 mg | ORAL_TABLET | Freq: Once | ORAL | Status: AC
  Administered 2022-08-09: 25 mg via ORAL
  Filled 2022-08-09: qty 1

## 2022-08-09 MED ORDER — METOCLOPRAMIDE HCL 5 MG/ML IJ SOLN
10.0000 mg | INTRAMUSCULAR | Status: AC
  Administered 2022-08-09: 10 mg via INTRAVENOUS
  Filled 2022-08-09: qty 2

## 2022-08-09 NOTE — Progress Notes (Signed)
Patient seen in triage. BBS CTA. VSS. Complains of vomiting, SOB and inability to keep fluids down with loss of appetite. RR 19. No cough. Intermittent smoker. No pulmonary history.

## 2022-08-09 NOTE — ED Provider Notes (Signed)
[name] AT [facility] Provider Note   CSN: [id number] Arrival date & time: 08/09/22  1817     History  Chief Complaint  Patient presents with   Shortness of Breath   Abdominal Pain   Emesis    Ashley Shelton is a 42 y.o. female.  42 year old female with a history of vertigo presents emergency department with headache and dizziness.  Says that yesterday she woke from sleeping with a frontal headache.  Describes it as a pounding sensation.  Different than prior headaches.  Says that it gradually worsened and then during the day started developing dizziness.  Says that it feels like the room is spinning.  Gets worse when she attempts to stand and when she closes her eyes.  Is still present at rest.  Also gets worse when she turns her head.  Denies any tinnitus or hearing loss, dysphagia, dysarthria, numbness or weakness of her arms or legs, vision changes.  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  Also says that she has been having significant nausea and vomiting since the headache started.       Home Medications Prior to Admission medications   Medication Sig Start Date End Date Taking? Authorizing Provider  meclizine (ANTIVERT) 25 MG tablet Take 1-2 tablets (25-50 mg total) by mouth 3 (three) times daily as needed for dizziness. 08/10/22   Rondel Baton, MD  metoCLOPramide (REGLAN) 10 MG tablet Take 1 tablet (10 mg total) by mouth every 6 (six) hours. 08/10/22   Rondel Baton, MD      Allergies    Patient has no known allergies.    Review of Systems   Review of Systems  Physical Exam Updated Vital Signs BP 124/85   Pulse 86   Temp 98 F (36.7 C)   Resp 20   Ht 5\' 6"  (1.676 m)   Wt 108.9 kg   LMP 08/08/2022   SpO2 100%   BMI 38.74 kg/m  Physical Exam Vitals and nursing note reviewed.  Constitutional:      General: She is not in acute distress.     Appearance: She is well-developed.  HENT:     Head: Normocephalic and atraumatic.     Right Ear: Tympanic membrane, ear canal and external ear normal.     Left Ear: Tympanic membrane, ear canal and external ear normal.     Nose: Nose normal.  Eyes:     Extraocular Movements: Extraocular movements intact.     Conjunctiva/sclera: Conjunctivae normal.     Pupils: Pupils are equal, round, and reactive to light.  Cardiovascular:     Rate and Rhythm: Normal rate and regular rhythm.     Heart sounds: No murmur heard. Pulmonary:     Effort: Pulmonary effort is normal. No respiratory distress.     Breath sounds: Normal breath sounds.  Musculoskeletal:     Cervical back: Normal range of motion and neck supple.     Right lower leg: No edema.     Left lower leg: No edema.  Skin:    General: Skin is warm and dry.  Neurological:     Mental Status: She is alert and oriented to person, place, and time. Mental status is at baseline.     Comments: MENTAL STATUS: AAOx3 CRANIAL NERVES: II: Pupils equal and reactive 5 mm mm BL, no RAPD, no VF deficits III, IV, VI: EOM  intact, no gaze preference or deviation, no nystagmus. V: normal sensation to light touch in V1, V2, and V3 segments bilaterally VII: no facial weakness or asymmetry, no nasolabial fold flattening VIII: normal hearing to speech and finger friction IX, X: normal palatal elevation, no uvular deviation XI: 5/5 head turn and 5/5 shoulder shrug bilaterally XII: midline tongue protrusion MOTOR: 5/5 strength in R shoulder flexion, elbow flexion and extension, and grip strength. 5/5 strength in L shoulder flexion, elbow flexion and extension, and grip strength.  5/5 strength in R hip and knee flexion, knee extension, ankle plantar and dorsiflexion. 5/5 strength in L hip and knee flexion, knee extension, ankle plantar and dorsiflexion. SENSORY: Normal sensation to light touch in all extremities COORD: Normal finger to nose and heel to shin, no  tremor, no dysmetria STATION: normal stance, no truncal ataxia GAIT: unsteady on her feet requiring assistance to ambulate  Dix-Hallpike and logroll test negative bilaterally  Psychiatric:        Mood and Affect: Mood normal.     ED Results / Procedures / Treatments   Labs (all labs ordered are listed, but only abnormal results are displayed) Labs Reviewed  COMPREHENSIVE METABOLIC PANEL - Abnormal; Notable for the following components:      Result Value   Sodium 134 (*)    CO2 18 (*)    Glucose, Bld 141 (*)    Total Protein 8.2 (*)    All other components within normal limits  LIPASE, BLOOD  CBC    EKG EKG Interpretation  Date/Time:  Thursday Aug 09 2022 18:44:45 EDT Ventricular Rate:  89 PR Interval:  124 QRS Duration: 82 QT Interval:  356 QTC Calculation: 433 R Axis:   119 Text Interpretation:  Suspect arm lead reversal, interpretation assumes no reversal Normal sinus rhythm Left posterior fascicular block T wave abnormality, consider anterior ischemia Abnormal ECG When compared with ECG of 29-Nov-2016 21:29, PREVIOUS ECG IS PRESENT Confirmed by Vonita Moss [phone]) on 08/09/2022 10:37:25 PM  Radiology CT Head Wo Contrast  Result Date: 08/09/2022 CLINICAL DATA:  Headache, dizziness, and nausea. Migraine onset yesterday with emesis. EXAM: CT HEAD WITHOUT CONTRAST TECHNIQUE: Contiguous axial images were obtained from the base of the skull through the vertex without intravenous contrast. RADIATION DOSE REDUCTION: This exam was performed according to the departmental dose-optimization program which includes automated exposure control, adjustment of the mA and/or kV according to patient size and/or use of iterative reconstruction technique. COMPARISON:  None Available. FINDINGS: Brain: No acute intracranial hemorrhage, midline shift or mass effect. No extra-axial fluid collection. Gray-white matter differentiation is within normal limits. No hydrocephalus. Vascular: No  hyperdense vessel or unexpected calcification. Skull: Normal. Negative for fracture or focal lesion. Sinuses/Orbits: No acute finding. Other: None. IMPRESSION: No acute intracranial process. Electronically Signed   By: Thornell Sartorius M.D.   On: 08/09/2022 23:12   DG Chest 2 View  Result Date: 08/09/2022 CLINICAL DATA:  Shortness of breath EXAM: CHEST - 2 VIEW COMPARISON:  08/08/2016 FINDINGS: The heart size and mediastinal contours are within normal limits. Both lungs are clear. The visualized skeletal structures are unremarkable. IMPRESSION: No active cardiopulmonary disease. Electronically Signed   By: Minerva Fester M.D.   On: 08/09/2022 19:29    Procedures Procedures    Medications Ordered in ED Medications  lactated ringers bolus 1,000 mL (0 mLs Intravenous Stopped 08/10/22 0017)  metoCLOPramide (REGLAN) injection 10 mg (10 mg Intravenous Given 08/09/22 2245)  diphenhydrAMINE (BENADRYL) injection 25 mg (25 mg Intravenous  Given 08/09/22 2244)  meclizine (ANTIVERT) tablet 25 mg (25 mg Oral Given 08/09/22 2251)    ED Course/ Medical Decision Making/ A&P                             Medical Decision Making Amount and/or Complexity of Data Reviewed Labs: ordered. Radiology: ordered.  Risk Prescription drug management.   Ashley Shelton is a 42 y.o. female with comorbidities that complicate the patient evaluation including vertigo who presents emergency department with headache and dizziness  Initial Ddx:  Peripheral vertigo, central vertigo, arrhythmia, stroke, migraine, complex migraine  MDM:  Unclear exactly what is causing the patient's symptoms at this time.  Given her history of BPPV and vertigo feel that peripheral vertigo is likely the cause is always although it somewhat atypical no nystagmus that would be concerning for an acute vestibular syndrome or other signs of a stroke such as dysmetria, dysarthria, or dysphagia. With her symptoms being worse with standing could also  potentially represent orthostasis so will give fluids. May also represent a complex migraine with the headache she is having. Will obtain ct imaging since this is different from her typical headaches.   Plan:  Labs Migraine cocktail CT head EKG  ED Summary/Re-evaluation:  CT head did not reveal any acute abnormalities.  EKG without any signs of arrhythmia.  Patient was feeling markedly better after the above interventions and was able to walk without difficulty.  With her response to this therapy and her lack of risk factors for stroke feel that more than likely is due to peripheral vertigo rather than a stroke.  Patient given a prescription of meclizine for her vertigo and instructed to follow-up with her primary doctor in several days regarding her symptoms.  This patient presents to the ED for concern of complaints listed in HPI, this involves an extensive number of treatment options, and is a complaint that carries with it a high risk of complications and morbidity. Disposition including potential need for admission considered.   Dispo: DC Home. Return precautions discussed including, but not limited to, those listed in the AVS. Allowed pt time to ask questions which were answered fully prior to dc.  Additional history obtained from spouse Records reviewed Outpatient Clinic Notes The following labs were independently interpreted: Chemistry and show no acute abnormality I independently reviewed the following imaging with scope of interpretation limited to determining acute life threatening conditions related to emergency care: CT Head and agree with the radiologist interpretation with the following exceptions: none I personally reviewed and interpreted cardiac monitoring: normal sinus rhythm  I personally reviewed and interpreted the pt's EKG: see above for interpretation  I have reviewed the patients home medications and made adjustments as needed        Final Clinical Impression(s)  / ED Diagnoses Final diagnoses:  Vertigo  Nonintractable headache, unspecified chronicity pattern, unspecified headache type    Rx / DC Orders ED Discharge Orders          Ordered    meclizine (ANTIVERT) 25 MG tablet  3 times daily PRN        08/10/22 0008    metoCLOPramide (REGLAN) 10 MG tablet  Every 6 hours,   Status:  Discontinued        08/10/22 0008    metoCLOPramide (REGLAN) 10 MG tablet  Every 6 hours        08/10/22 0009  Rondel Baton, MD 08/10/22 1314

## 2022-08-09 NOTE — ED Notes (Signed)
Patient resting quietly in stretcher, respirations even, unlabored, no acute distress noted. Denies needs at this time.  

## 2022-08-09 NOTE — ED Triage Notes (Signed)
Patient arrives in wheelchair by POV c/o migraine onset of yesterday along with emesis and unable to keep anything down. Now c/o dizziness, abdominal pain and weakness.

## 2022-08-10 MED ORDER — METOCLOPRAMIDE HCL 10 MG PO TABS: 10.0000 mg | ORAL_TABLET | Freq: Four times a day (QID) | ORAL | 0 refills | Status: DC

## 2022-08-10 MED ORDER — METOCLOPRAMIDE HCL 10 MG PO TABS: 10.0000 mg | ORAL_TABLET | Freq: Four times a day (QID) | ORAL | 0 refills | Status: AC

## 2022-08-10 MED ORDER — MECLIZINE HCL 25 MG PO TABS: 25.0000 mg | ORAL_TABLET | Freq: Three times a day (TID) | ORAL | 0 refills | Status: AC | PRN

## 2022-08-10 NOTE — ED Notes (Signed)
Reviewed AVS with patient, patient expressed understanding of directions, denies further questions at this time. 

## 2022-08-10 NOTE — ED Notes (Signed)
Patient ambulated to restroom with steady gait.

## 2022-08-10 NOTE — Discharge Instructions (Signed)
You were seen for your headache and dizziness in the emergency department.   At home, please take the meclizine that we have prescribed you for your dizziness.  You may also use the Reglan we have prescribed you for any nausea or vomiting that you have.  Take Tylenol and ibuprofen for your headache  Check your MyChart online for the results of any tests that had not resulted by the time you left the emergency department.   Follow-up with your primary doctor in 2-3 days regarding your visit.    Return immediately to the emergency department if you experience any of the following: Double vision, difficulty speaking or swallowing, or any other concerning symptoms.    Thank you for visiting our Emergency Department. It was a pleasure taking care of you today.
